# Patient Record
Sex: Female | Born: 2000 | Race: Black or African American | Hispanic: No | Marital: Single | Smoking: Never smoker
Health system: Southern US, Community
[De-identification: ages and names within clinical notes are randomized; demographics above are authoritative.]

## PROBLEM LIST (undated history)

## (undated) DIAGNOSIS — F909 Attention-deficit hyperactivity disorder, unspecified type: Secondary | ICD-10-CM

## (undated) DIAGNOSIS — F419 Anxiety disorder, unspecified: Secondary | ICD-10-CM

## (undated) DIAGNOSIS — F329 Major depressive disorder, single episode, unspecified: Secondary | ICD-10-CM

## (undated) DIAGNOSIS — F4310 Post-traumatic stress disorder, unspecified: Secondary | ICD-10-CM

## (undated) DIAGNOSIS — F32A Depression, unspecified: Secondary | ICD-10-CM

---
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
## 2021-01-29 IMAGING — CR DG CHEST 2V
2 series · 2 of 2 positions shown · non-contrast
Comparison: None.

CLINICAL DATA: 19-year-old female with shortness of breath.

EXAM:
CHEST - 2 VIEW

[chest pa]
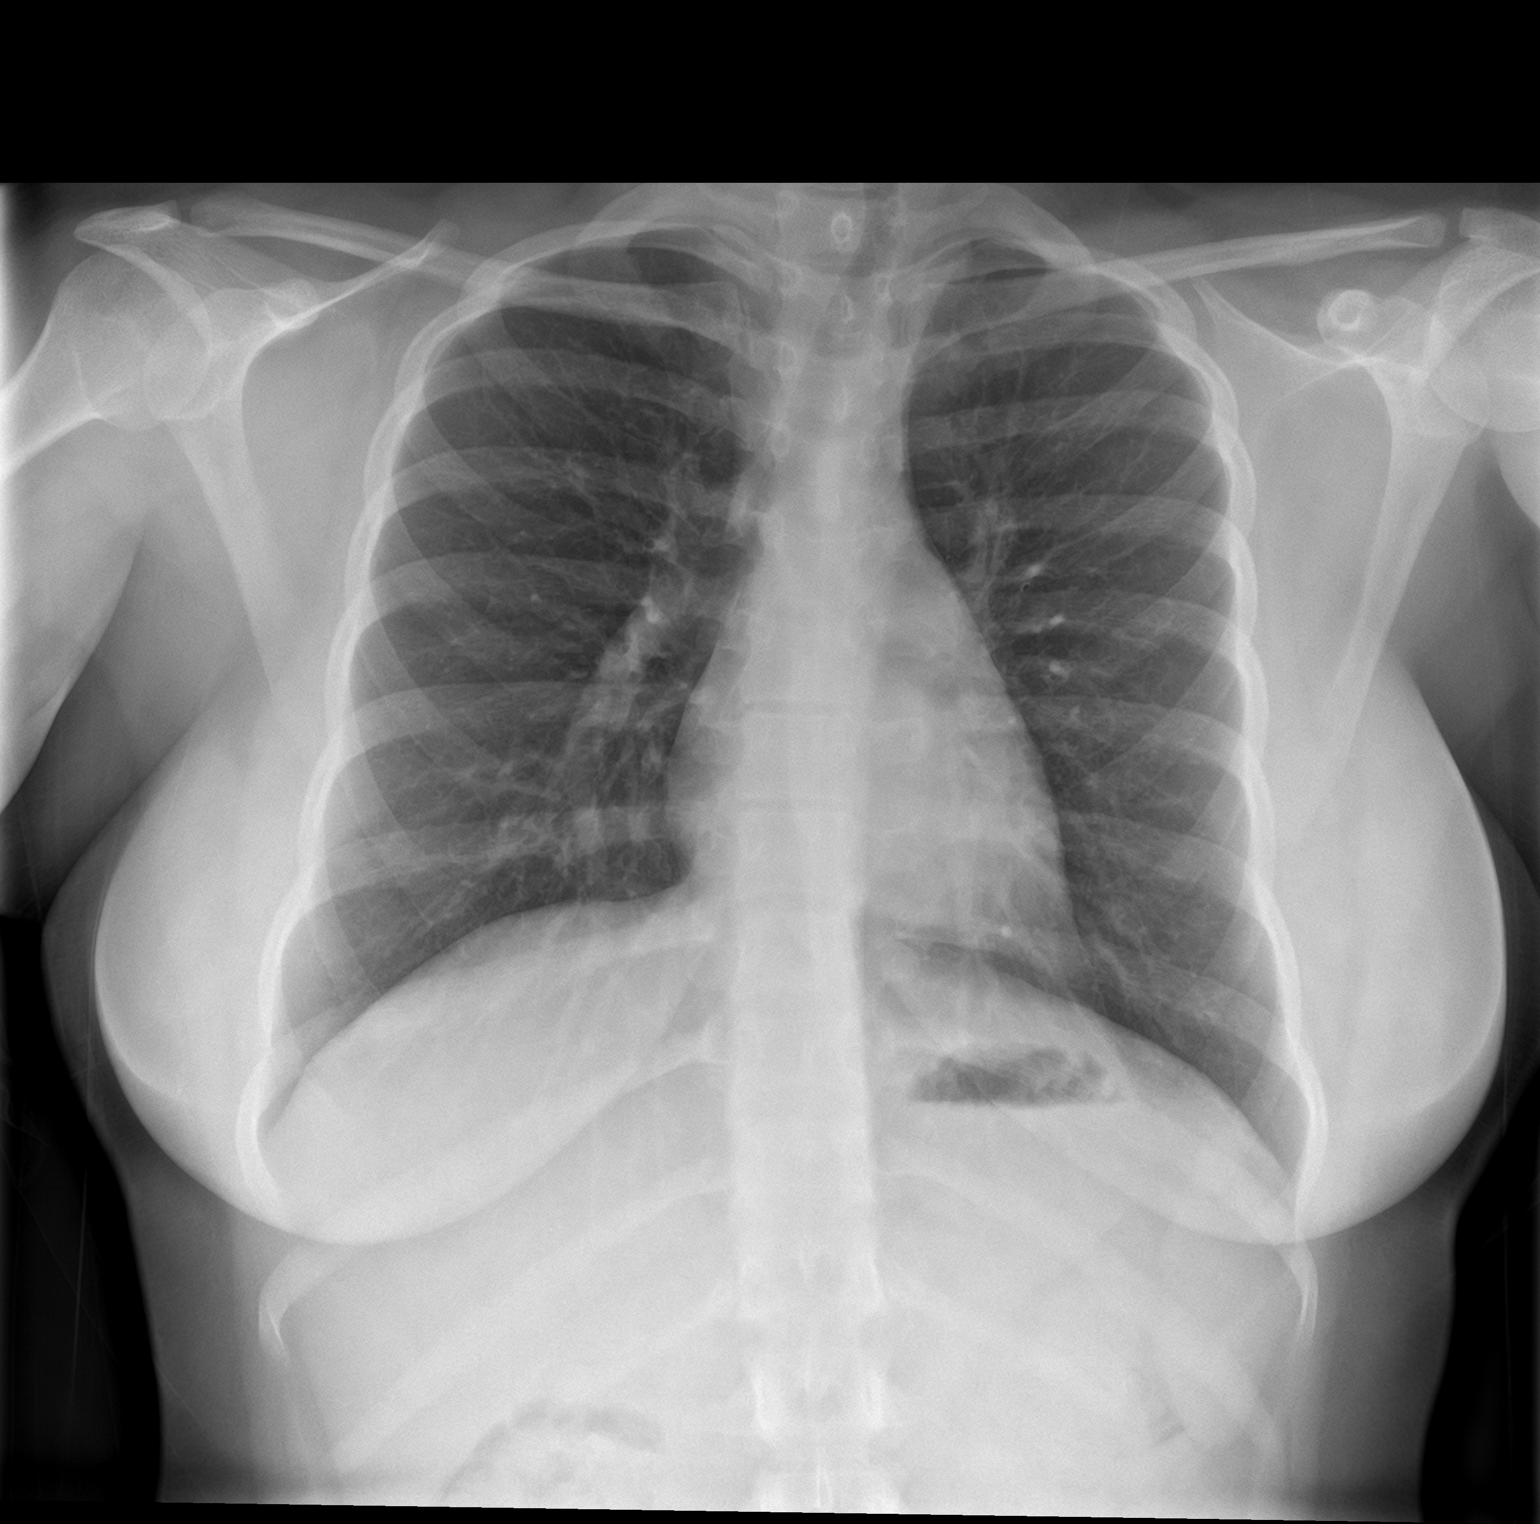

[chest lat]
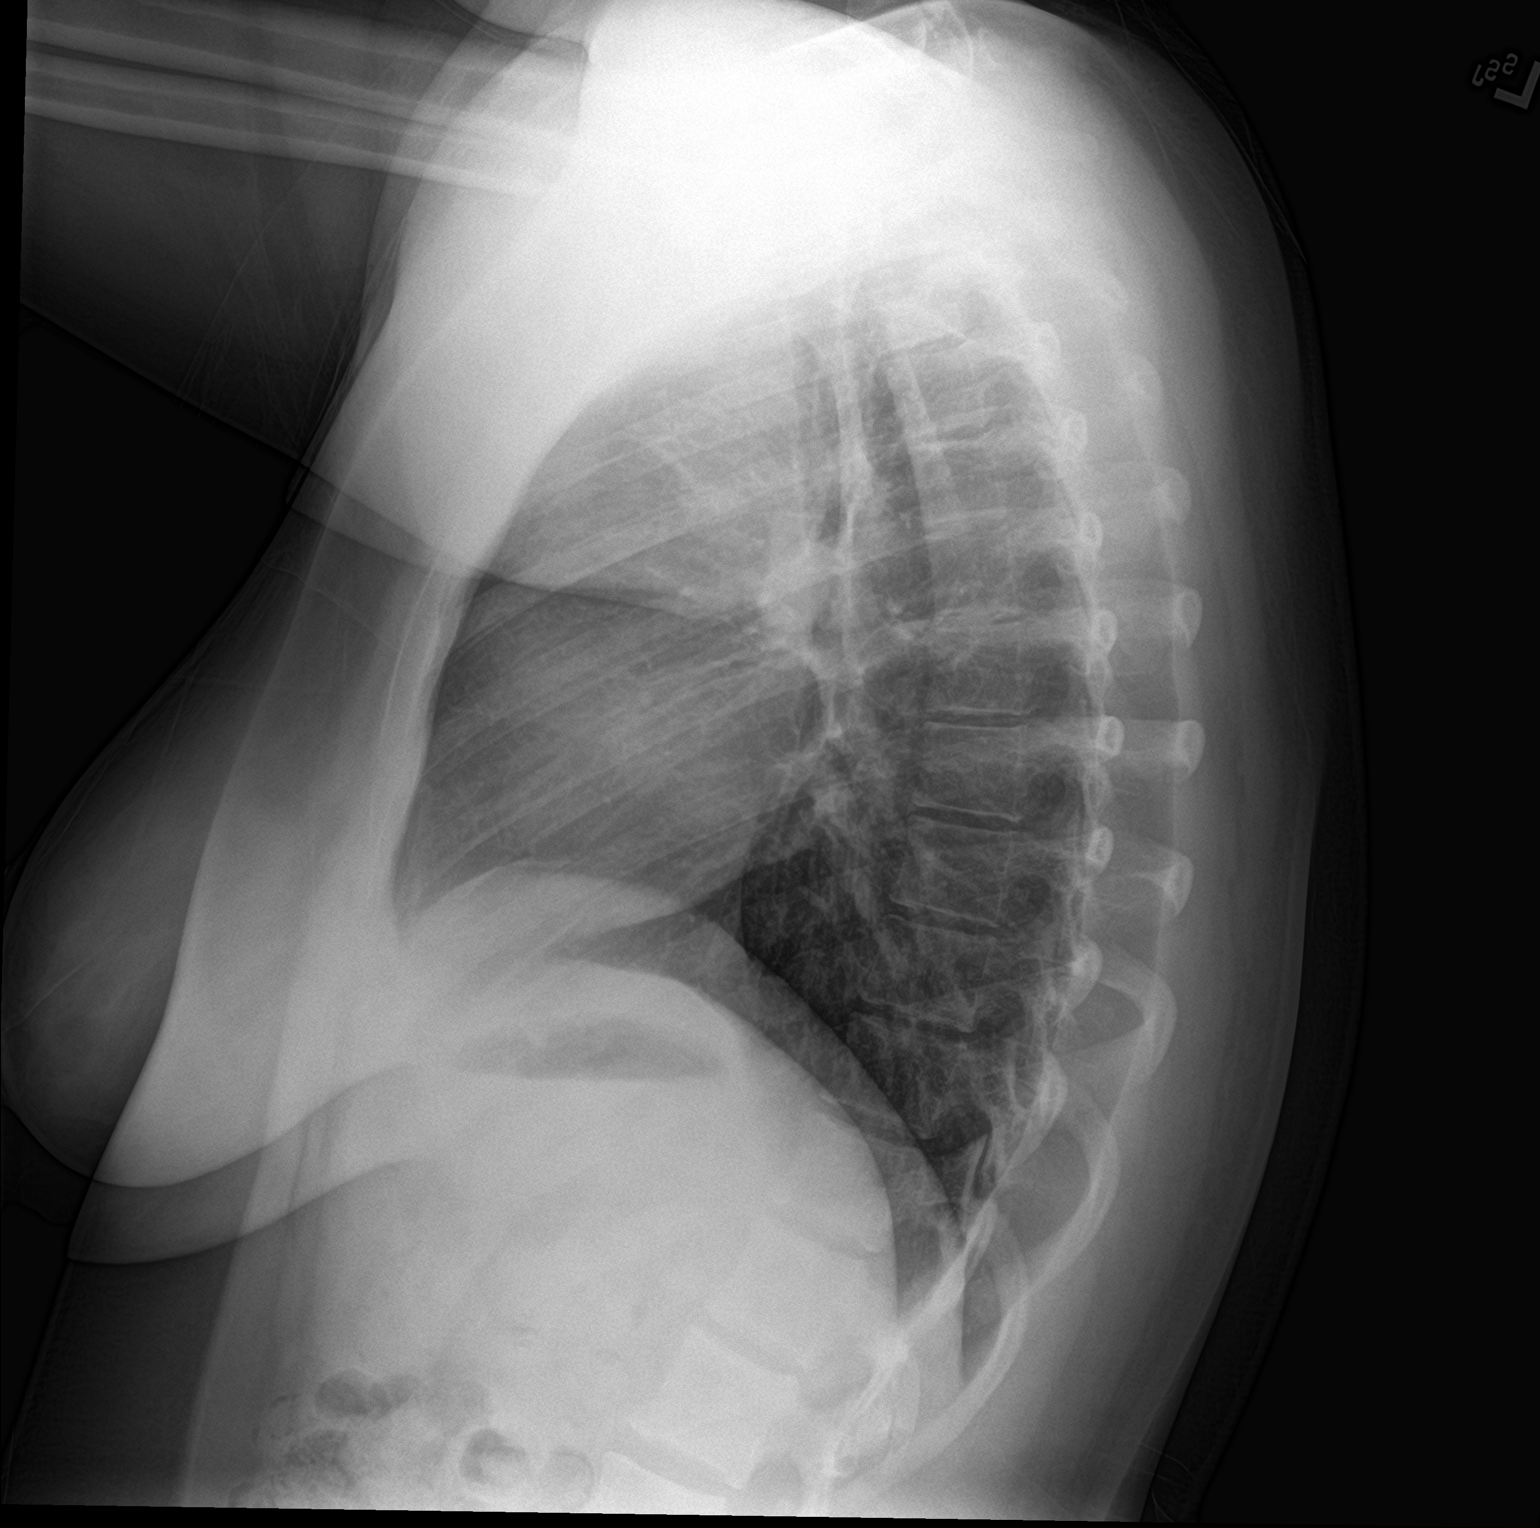

[2 of 2 positions shown; findings below may reference images not displayed]

FINDINGS: The heart size and mediastinal contours are within normal limits.
Both lungs are clear. The visualized skeletal structures are
unremarkable.
IMPRESSION: No active cardiopulmonary disease.
